# Patient Record
Sex: Male | Born: 2005 | Race: White | Hispanic: No | Marital: Single
Health system: Southern US, Community
[De-identification: ages and names within clinical notes are randomized; demographics above are authoritative.]

## PROBLEM LIST (undated history)

## (undated) DIAGNOSIS — Z9109 Other allergy status, other than to drugs and biological substances: Secondary | ICD-10-CM

---
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
## 2023-05-08 ENCOUNTER — Encounter (HOSPITAL_COMMUNITY): Payer: Self-pay

## 2023-05-08 ENCOUNTER — Emergency Department (HOSPITAL_COMMUNITY)

## 2023-05-08 ENCOUNTER — Other Ambulatory Visit: Payer: Self-pay

## 2023-05-08 ENCOUNTER — Emergency Department (HOSPITAL_COMMUNITY)
Admission: EM | Admit: 2023-05-08 | Discharge: 2023-05-08 | Disposition: A | Discharge status: Home / Self Care | Attending: Emergency Medicine | Admitting: Emergency Medicine

## 2023-05-08 DIAGNOSIS — R110 Nausea: Secondary | ICD-10-CM | POA: Diagnosis not present

## 2023-05-08 DIAGNOSIS — Z5329 Procedure and treatment not carried out because of patient's decision for other reasons: Secondary | ICD-10-CM | POA: Insufficient documentation

## 2023-05-08 DIAGNOSIS — Z5321 Procedure and treatment not carried out due to patient leaving prior to being seen by health care provider: Secondary | ICD-10-CM

## 2023-05-08 DIAGNOSIS — R109 Unspecified abdominal pain: Secondary | ICD-10-CM | POA: Insufficient documentation

## 2023-05-08 DIAGNOSIS — R1031 Right lower quadrant pain: Secondary | ICD-10-CM | POA: Diagnosis not present

## 2023-05-08 LAB — COMPREHENSIVE METABOLIC PANEL
ALT: 20 U/L (ref 0–44)
AST: 21 U/L (ref 15–41)
Albumin: 4.3 g/dL (ref 3.5–5.0)
Alkaline Phosphatase: 63 U/L (ref 38–126)
Anion gap: 10 (ref 5–15)
BUN: 21 mg/dL — ABNORMAL HIGH (ref 6–20)
CO2: 23 mmol/L (ref 22–32)
Calcium: 9.1 mg/dL (ref 8.9–10.3)
Chloride: 105 mmol/L (ref 98–111)
Creatinine, Ser: 0.85 mg/dL (ref 0.61–1.24)
GFR, Estimated: >60 mL/min (ref >60)
Glucose, Bld: 125 mg/dL — ABNORMAL HIGH (ref 70–99)
Potassium: 3.6 mmol/L (ref 3.5–5.1)
Sodium: 138 mmol/L (ref 135–145)
Total Bilirubin: 0.8 mg/dL (ref 0.0–1.2)
Total Protein: 6.7 g/dL (ref 6.5–8.1)

## 2023-05-08 LAB — CBC WITH DIFFERENTIAL/PLATELET
Abs Immature Granulocytes: 0.04 10*3/uL (ref 0.00–0.07)
Basophils Absolute: 0 10*3/uL (ref 0.0–0.1)
Basophils Relative: 1 %
Eosinophils Absolute: 0.1 10*3/uL (ref 0.0–0.5)
Eosinophils Relative: 1 %
HCT: 45.2 % (ref 39.0–52.0)
Hemoglobin: 15.2 g/dL (ref 13.0–17.0)
Immature Granulocytes: 1 %
Lymphocytes Relative: 35 %
Lymphs Abs: 1.7 10*3/uL (ref 0.7–4.0)
MCH: 30.2 pg (ref 26.0–34.0)
MCHC: 33.6 g/dL (ref 30.0–36.0)
MCV: 89.9 fL (ref 80.0–100.0)
Monocytes Absolute: 0.5 10*3/uL (ref 0.1–1.0)
Monocytes Relative: 9 %
Neutro Abs: 2.6 10*3/uL (ref 1.7–7.7)
Neutrophils Relative %: 53 %
Platelets: 243 10*3/uL (ref 150–400)
RBC: 5.03 MIL/uL (ref 4.22–5.81)
RDW: 12.5 % (ref 11.5–15.5)
WBC: 4.9 10*3/uL (ref 4.0–10.5)
nRBC: 0 % (ref 0.0–0.2)

## 2023-05-08 LAB — URINALYSIS, ROUTINE W REFLEX MICROSCOPIC
Bacteria, UA: NONE SEEN
Bilirubin Urine: NEGATIVE
Glucose, UA: NEGATIVE mg/dL
Ketones, ur: NEGATIVE mg/dL
Leukocytes,Ua: NEGATIVE
Nitrite: NEGATIVE
Protein, ur: NEGATIVE mg/dL
RBC / HPF: >50 RBC/hpf (ref 0–5)
Specific Gravity, Urine: 1.023 (ref 1.005–1.030)
pH: 5 (ref 5.0–8.0)

## 2023-05-08 NOTE — ED Notes (Signed)
 Pt back from CT

## 2023-05-08 NOTE — ED Provider Notes (Signed)
 [place] EMERGENCY DEPARTMENT AT [facility] Provider Note   CSN: [id number] Arrival date & time: 05/08/23  1228     History Chief Complaint  Patient presents with   Flank Pain    Lynelle Doctor. Futch is a 18 y.o. male reportedly otherwise healthy presents emerged from today for evaluation of right-sided flank pain that lasted for approximately 1 hour.  Reports that the pain subsided around 20 minutes into ER visit.  Reports he was having some nausea but no vomiting.  Denies any abdominal pain.  He did feel the pain is radiating into his right testicle but was not having any testicle pain.  No swelling.  Denies concern for STDs. Has not had any urination since then.  Reports normal bowel movement in the morning. Denies any fever. Reports he feels back to baseline now. NKDA. Denies any tobacco, EtOH, or illicit drug use.    Flank Pain Pertinent negatives include no chest pain, no abdominal pain and no shortness of breath.       Home Medications Prior to Admission medications   Medication Sig Start Date End Date Taking? Authorizing Provider  fluticasone (FLONASE) 50 MCG/ACT nasal spray Place 2 sprays into both nostrils daily. 05/21/20   Bennie Pierini, FNP      Allergies    Patient has no known allergies.    Review of Systems   Review of Systems  Constitutional:  Negative for chills and fever.  Respiratory:  Negative for shortness of breath.   Cardiovascular:  Negative for chest pain.  Gastrointestinal:  Positive for nausea. Negative for abdominal pain, blood in stool, constipation, diarrhea and vomiting.  Genitourinary:  Positive for flank pain and testicular pain. Negative for penile discharge, penile pain, penile swelling and scrotal swelling.    Physical Exam Updated Vital Signs BP 122/77   Pulse 78   Temp 99 F (37.2 C) (Oral)   Resp (!) 22   Ht 5\' 8"  (1.727 m)   Wt 86.2 kg   SpO2 99%   BMI 28.89 kg/m  Physical Exam Vitals and nursing note  reviewed. Exam conducted with a chaperone present Joni Reining, Charity fundraiser).  Constitutional:      General: He is not in acute distress.    Appearance: He is not ill-appearing or toxic-appearing.     Comments: Patient is lying on the stretcher in no acute distress. On phone, appears comfortable.   Eyes:     General: No scleral icterus. Cardiovascular:     Rate and Rhythm: Normal rate.  Pulmonary:     Effort: Pulmonary effort is normal. No respiratory distress.  Abdominal:     General: Bowel sounds are normal.     Palpations: Abdomen is soft.     Tenderness: There is no abdominal tenderness. There is no right CVA tenderness, left CVA tenderness, guarding or rebound.  Genitourinary:    Penis: Circumcised.      Testes:        Right: Mass, tenderness or swelling not present.        Left: Mass, tenderness or swelling not present.     Epididymis:     Right: Normal.     Left: Normal.     Comments: Right testicle is slightly lower hanging than on the left. Patient reports it is his chronic state. No tenderness, swelling, or other abnormality seen or palpated.  Skin:    General: Skin is warm and dry.  Neurological:     Mental Status: He is alert.  ED Results / Procedures / Treatments   Labs (all labs ordered are listed, but only abnormal results are displayed) Labs Reviewed  COMPREHENSIVE METABOLIC PANEL - Abnormal; Notable for the following components:      Result Value   Glucose, Bld 125 (*)    BUN 21 (*)    All other components within normal limits  CBC WITH DIFFERENTIAL/PLATELET  URINALYSIS, ROUTINE W REFLEX MICROSCOPIC    EKG None  Radiology No results found.  Procedures Procedures   Medications Ordered in ED Medications - No data to display  ED Course/ Medical Decision Making/ A&P  Medical Decision Making Amount and/or Complexity of Data Reviewed Labs: ordered. Radiology: ordered.   18 y.o. male presents to the ER for evaluation of right sided flank pain.  Differential diagnosis includes but is not limited to AAA, renal vascular thrombosis, mesenteric ischemia, pyelonephritis, nephrolithiasis, cystitis, biliary colic, pancreatitis, PUD, appendicitis, diverticulitis, bowel obstruction, testicular torsion, Epididymitis. Vital signs shows RR of 22, however the patient had a normal RR while I was in the room. Otherwise unremarkable. Physical exam as noted above.   Upon my evaluation. The patient reports that he feels back to normal. He is not experiencing any radiating pain or flank pain. He has not urinated since the symptoms started. He does not have a h/o kidney stones. Labs ordered in triage. No abdominal or CVA tenderness. GU exam shows that his right testicle is a little lower than his left which the patient reports is chronic for him. No tenderness, swelling, or overlying skin changes noted. Will order CT renal. Can consider ordering US of the testicles if unremarkable. Possibly intermittent torsion however the patient is not experiencing any pain now.   I independently reviewed and interpreted the patient's labs.  CBC unremarkable.  CMP shows Leukos at 125, BUN at 21.  Otherwise, no other electrolyte or LFT abnormality.  Urinalysis shows large amount hemoglobin greater than 50 red blood cells present.  No other abnormalities or signs of infection.  CT renal done, but reading is still pending. Updated patient and family on delay. The patient is on his phone lying on the stretcher in no acute distress. Still not having any pain.  Given microscopic blood in patient's urine, question if he did not recently passed a kidney stone.   3:17 PM Was alerted by nursing staff that the patient did not want to wait any longer and eloped with his family. Still did not appear to be in any acute distress.   Portions of this report may have been transcribed using voice recognition software. Every effort was made to ensure accuracy; however, inadvertent computerized  transcription errors may be present.   Final Clinical Impression(s) / ED Diagnoses Final diagnoses:  Eloped from emergency department    Rx / DC Orders ED Discharge Orders     None         Achille Rich, Cordelia Poche 05/10/23 0024    Gerhard Munch, MD 05/13/23 1414

## 2023-05-08 NOTE — ED Notes (Signed)
 Pt stated, "I am tired of waiting." Asked to sign out AMA. This nurse encouraged pt to speak with PA before leaving pt stated, "No." AMA consent was given and signed by pt.   Pt ambulated to restroom before leaving. Unable to obtain all VS before leaving.

## 2023-05-08 NOTE — ED Triage Notes (Signed)
 Pt arrived via [facility] c/o sharp stabbing right flank pain that began today. Pt reports pain radiates to his right testicle as well.

## 2023-05-09 NOTE — ED Provider Notes (Incomplete)
 [place] EMERGENCY DEPARTMENT AT [facility] Provider Note   CSN: [id number] Arrival date & time: 05/08/23  1228     History Chief Complaint  Patient presents with  . Flank Pain    Lynelle Doctor. [name] is a 18 y.o. male reportedly otherwise healthy presents emerged from today for evaluation of right-sided flank pain that lasted for approximately 1 hour.  Orts that the pain subsided around 20 minutes into ER visit.  Reports he was having some nausea but no vomiting.  Denies any abdominal pain.  He did feel the pain is radiating into his right testicle but was not having any testicle pain.  Has not had any urination since then.  Reports normal bowel movement in the morning.   Flank Pain       Home Medications Prior to Admission medications   Medication Sig Start Date End Date Taking? Authorizing Provider  fluticasone (FLONASE) 50 MCG/ACT nasal spray Place 2 sprays into both nostrils daily. 05/21/20   Bennie Pierini, FNP      Allergies    Patient has no known allergies.    Review of Systems   Review of Systems  Genitourinary:  Positive for flank pain.    Physical Exam Updated Vital Signs BP 122/77   Pulse 78   Temp 99 F (37.2 C) (Oral)   Resp (!) 22   Ht 5\' 8"  (1.727 m)   Wt 86.2 kg   SpO2 99%   BMI 28.89 kg/m  Physical Exam  ED Results / Procedures / Treatments   Labs (all labs ordered are listed, but only abnormal results are displayed) Labs Reviewed  COMPREHENSIVE METABOLIC PANEL - Abnormal; Notable for the following components:      Result Value   Glucose, Bld 125 (*)    BUN 21 (*)    All other components within normal limits  CBC WITH DIFFERENTIAL/PLATELET  URINALYSIS, ROUTINE W REFLEX MICROSCOPIC    EKG None  Radiology No results found.  Procedures Procedures  {Document cardiac monitor, telemetry assessment procedure when appropriate:1}  Medications Ordered in ED Medications - No data to display  ED Course/ Medical  Decision Making/ A&P   {   Click here for ABCD2, HEART and other calculatorsREFRESH Note before signing :1}                              Medical Decision Making Amount and/or Complexity of Data Reviewed Labs: ordered. Radiology: ordered.   18 y.o. male presents to the ER for evaluation of ***. Differential diagnosis includes but is not limited to ***. Vital signs ***. Physical exam as noted above.   I independently reviewed and interpreted the patient's labs. ***.  After consideration of the diagnostic results and the patients response to treatment, I feel that *** .   Emergency department workup does*** not suggest an emergent condition requiring admission or immediate intervention beyond what has been performed at this time.   ***We discussed the results of the labs/imaging. The plan is ***. We discussed strict return precautions and red flag symptoms. The patient verbalized their understanding and agrees to the plan. The patient is stable and being discharged home in good condition.  Portions of this report may have been transcribed using voice recognition software. Every effort was made to ensure accuracy; however, inadvertent computerized transcription errors may be present.    3:17 PM Was alerted by nursing staff that the patient did not want  to wait any longer and eloped with his family. Still did not appear to be in any acute distress.  Final Clinical Impression(s) / ED Diagnoses Final diagnoses:  None    Rx / DC Orders ED Discharge Orders     None
